# Patient Record
Sex: Female | Born: 1955 | Race: White | Hispanic: No | Marital: Married | Smoking: Former smoker
Health system: Southern US, Community
[De-identification: ages and names within clinical notes are randomized; demographics above are authoritative.]

## PROBLEM LIST (undated history)

## (undated) DIAGNOSIS — N951 Menopausal and female climacteric states: Secondary | ICD-10-CM

## (undated) DIAGNOSIS — F410 Panic disorder [episodic paroxysmal anxiety] without agoraphobia: Secondary | ICD-10-CM

## (undated) DIAGNOSIS — M8580 Other specified disorders of bone density and structure, unspecified site: Secondary | ICD-10-CM

## (undated) DIAGNOSIS — K2100 Gastro-esophageal reflux disease with esophagitis, without bleeding: Secondary | ICD-10-CM

## (undated) DIAGNOSIS — J309 Allergic rhinitis, unspecified: Secondary | ICD-10-CM

## (undated) DIAGNOSIS — B001 Herpesviral vesicular dermatitis: Secondary | ICD-10-CM

## (undated) DIAGNOSIS — R079 Chest pain, unspecified: Secondary | ICD-10-CM

## (undated) DIAGNOSIS — R0602 Shortness of breath: Secondary | ICD-10-CM

## (undated) DIAGNOSIS — H9190 Unspecified hearing loss, unspecified ear: Secondary | ICD-10-CM

## (undated) DIAGNOSIS — K589 Irritable bowel syndrome without diarrhea: Secondary | ICD-10-CM

## (undated) DIAGNOSIS — E785 Hyperlipidemia, unspecified: Secondary | ICD-10-CM

## (undated) DIAGNOSIS — M1712 Unilateral primary osteoarthritis, left knee: Secondary | ICD-10-CM

## (undated) HISTORY — DX: Menopausal and female climacteric states: N95.1

## (undated) HISTORY — DX: Herpesviral vesicular dermatitis: B00.1

## (undated) HISTORY — DX: Hyperlipidemia, unspecified: E78.5

## (undated) HISTORY — DX: Irritable bowel syndrome, unspecified: K58.9

## (undated) HISTORY — DX: Gastro-esophageal reflux disease with esophagitis: K21.0

## (undated) HISTORY — DX: Shortness of breath: R06.02

## (undated) HISTORY — DX: Unspecified hearing loss, unspecified ear: H91.90

## (undated) HISTORY — DX: Unilateral primary osteoarthritis, left knee: M17.12

## (undated) HISTORY — DX: Chest pain, unspecified: R07.9

## (undated) HISTORY — DX: Panic disorder (episodic paroxysmal anxiety): F41.0

## (undated) HISTORY — DX: Gastro-esophageal reflux disease with esophagitis, without bleeding: K21.00

## (undated) HISTORY — DX: Other specified disorders of bone density and structure, unspecified site: M85.80

## (undated) HISTORY — DX: Allergic rhinitis, unspecified: J30.9

---

## 1981-03-16 HISTORY — PX: OTHER SURGICAL HISTORY: SHX169

## 2000-02-17 ENCOUNTER — Other Ambulatory Visit: Admission: RE | Admit: 2000-02-17 | Discharge: 2000-02-17 | Payer: Self-pay | Admitting: Gynecology

## 2000-02-18 ENCOUNTER — Encounter: Payer: Self-pay | Admitting: Gynecology

## 2000-02-18 ENCOUNTER — Encounter: Admission: RE | Admit: 2000-02-18 | Discharge: 2000-02-18 | Payer: Self-pay | Admitting: Gynecology

## 2000-02-24 ENCOUNTER — Encounter: Admission: RE | Admit: 2000-02-24 | Discharge: 2000-02-24 | Payer: Self-pay | Admitting: Gynecology

## 2000-02-24 ENCOUNTER — Encounter: Payer: Self-pay | Admitting: Gynecology

## 2001-04-26 ENCOUNTER — Other Ambulatory Visit: Admission: RE | Admit: 2001-04-26 | Discharge: 2001-04-26 | Payer: Self-pay | Admitting: Gynecology

## 2002-07-18 ENCOUNTER — Encounter: Admission: RE | Admit: 2002-07-18 | Discharge: 2002-07-18 | Payer: Self-pay | Admitting: Gynecology

## 2002-07-18 ENCOUNTER — Encounter: Payer: Self-pay | Admitting: Gynecology

## 2002-07-25 ENCOUNTER — Other Ambulatory Visit: Admission: RE | Admit: 2002-07-25 | Discharge: 2002-07-25 | Payer: Self-pay | Admitting: Gynecology

## 2003-10-22 ENCOUNTER — Other Ambulatory Visit: Admission: RE | Admit: 2003-10-22 | Discharge: 2003-10-22 | Payer: Self-pay | Admitting: Gynecology

## 2003-10-22 ENCOUNTER — Encounter: Admission: RE | Admit: 2003-10-22 | Discharge: 2003-10-22 | Payer: Self-pay | Admitting: Gynecology

## 2004-12-01 ENCOUNTER — Other Ambulatory Visit: Admission: RE | Admit: 2004-12-01 | Discharge: 2004-12-01 | Payer: Self-pay | Admitting: Gynecology

## 2005-07-30 ENCOUNTER — Other Ambulatory Visit: Admission: RE | Admit: 2005-07-30 | Discharge: 2005-07-30 | Payer: Self-pay | Admitting: Gynecology

## 2006-01-25 ENCOUNTER — Other Ambulatory Visit: Admission: RE | Admit: 2006-01-25 | Discharge: 2006-01-25 | Payer: Self-pay | Admitting: Gynecology

## 2006-02-11 ENCOUNTER — Encounter: Admission: RE | Admit: 2006-02-11 | Discharge: 2006-02-11 | Payer: Self-pay | Admitting: Gynecology

## 2007-02-22 ENCOUNTER — Other Ambulatory Visit: Admission: RE | Admit: 2007-02-22 | Discharge: 2007-02-22 | Payer: Self-pay | Admitting: Gynecology

## 2007-12-15 ENCOUNTER — Encounter: Admission: RE | Admit: 2007-12-15 | Discharge: 2007-12-15 | Payer: Self-pay | Admitting: Gynecology

## 2008-03-16 HISTORY — PX: HYSTEROSCOPY W/ ENDOMETRIAL ABLATION: SUR665

## 2008-06-14 ENCOUNTER — Ambulatory Visit (HOSPITAL_BASED_OUTPATIENT_CLINIC_OR_DEPARTMENT_OTHER): Admission: RE | Admit: 2008-06-14 | Discharge: 2008-06-14 | Payer: Self-pay | Admitting: Gynecology

## 2008-06-14 ENCOUNTER — Encounter (INDEPENDENT_AMBULATORY_CARE_PROVIDER_SITE_OTHER): Payer: Self-pay | Admitting: Gynecology

## 2009-06-17 ENCOUNTER — Encounter: Admission: RE | Admit: 2009-06-17 | Discharge: 2009-06-17 | Payer: Self-pay | Admitting: Gynecology

## 2010-03-28 ENCOUNTER — Encounter: Payer: Self-pay | Admitting: Cardiology

## 2010-04-14 ENCOUNTER — Encounter: Payer: Self-pay | Admitting: Cardiology

## 2010-04-14 ENCOUNTER — Ambulatory Visit
Admission: RE | Admit: 2010-04-14 | Discharge: 2010-04-14 | Payer: Self-pay | Source: Home / Self Care | Attending: Cardiology | Admitting: Cardiology

## 2010-04-14 DIAGNOSIS — R0602 Shortness of breath: Secondary | ICD-10-CM

## 2010-04-14 DIAGNOSIS — R079 Chest pain, unspecified: Secondary | ICD-10-CM

## 2010-04-14 HISTORY — DX: Chest pain, unspecified: R07.9

## 2010-04-14 HISTORY — DX: Shortness of breath: R06.02

## 2010-04-17 ENCOUNTER — Telehealth (INDEPENDENT_AMBULATORY_CARE_PROVIDER_SITE_OTHER): Payer: Self-pay | Admitting: *Deleted

## 2010-04-21 ENCOUNTER — Ambulatory Visit (HOSPITAL_COMMUNITY): Payer: Managed Care, Other (non HMO) | Attending: Cardiology

## 2010-04-21 ENCOUNTER — Encounter: Payer: Self-pay | Admitting: Cardiology

## 2010-04-21 ENCOUNTER — Ambulatory Visit (HOSPITAL_BASED_OUTPATIENT_CLINIC_OR_DEPARTMENT_OTHER): Payer: Managed Care, Other (non HMO)

## 2010-04-21 DIAGNOSIS — R0989 Other specified symptoms and signs involving the circulatory and respiratory systems: Secondary | ICD-10-CM

## 2010-04-21 DIAGNOSIS — R5381 Other malaise: Secondary | ICD-10-CM | POA: Insufficient documentation

## 2010-04-21 DIAGNOSIS — R0609 Other forms of dyspnea: Secondary | ICD-10-CM | POA: Insufficient documentation

## 2010-04-21 DIAGNOSIS — Z8249 Family history of ischemic heart disease and other diseases of the circulatory system: Secondary | ICD-10-CM | POA: Insufficient documentation

## 2010-04-21 DIAGNOSIS — R072 Precordial pain: Secondary | ICD-10-CM | POA: Insufficient documentation

## 2010-04-21 DIAGNOSIS — E785 Hyperlipidemia, unspecified: Secondary | ICD-10-CM | POA: Insufficient documentation

## 2010-04-23 NOTE — Progress Notes (Signed)
Summary: Stress Echo pre procedure  Phone Note Outgoing Call Call back at Home Phone [phone]   Summary of Call: Annice Pih call patient with information for rest/stress echo on Monday, 04/21/10 at 3:00 p.m.; stress at 4 p.m.

## 2010-04-23 NOTE — Assessment & Plan Note (Signed)
Summary: dx: dypsnea on excertion/lg   Visit Type:  Initial Consult Primary Provider:  Dr. Eloise Harman  CC:  family hx -- dyspnea -- heart burn?.  History of Present Illness: 55 yo with history of GERD and hyperlipidemia presents for evaluation of dyspnea and chest pain.  Patient is worried due to her family history of heart disease (mother with MI, father with atrial fibrillation).  She reports episodes of tightness across her chest that occur 1-2 times a month. They last anywhere from 5-30 minutes at a time.  They are not related to meals and do not feel like her typical GERD.  They are not exertional and usually occur while she is sitting at her desk at work.  She does not get exertional chest pain but she does get some exertional shortness of breath.  She notes dyspnea walking up a flight of steps or when she exercises on a treadmill.  She does report less activity in general over the last year.    ECG: NSR, normal  Labs (1/12): K 4.1, creatinine 0.7, LDL 108, HDL 59  Preventive Screening-Counseling & Management  Alcohol-Tobacco     Smoking Status: quit  Caffeine-Diet-Exercise     Does Patient Exercise: yes      Drug Use:  no.    Past History:  Past Medical History: 1. Hyperlipidemia 2. Uterine fibroids, s/p surgery 3. GERD  Past Surgical History: fibroid tumors removed tubal ligation  Family History: Mother with MI at 39 Father with atrial fibrillation Brother with HTN  Social History: Full Time -- Advertising account executive  Married -- 2 kids Tobacco Use - Former. (quit around 2000) Alcohol Use - yes -- social Regular Exercise - yes Drug Use - no Smoking Status:  quit Does Patient Exercise:  yes Drug Use:  no  Review of Systems       All systems reviewed and negative except as per HPI.   Vital Signs:  Patient profile:   55 year old female Height:      67 inches Weight:      185 pounds BMI:     29.08 Pulse rate:   67 / minute BP sitting:   136 / 80  (left  arm) Cuff size:   regular  Vitals Entered By: Hardin Negus, RMA (April 14, 2010 10:47 AM)  Physical Exam  General:  Well developed, well nourished, in no acute distress. Head:  normocephalic and atraumatic Nose:  no deformity, discharge, inflammation, or lesions Mouth:  Teeth, gums and palate normal. Oral mucosa normal. Neck:  Neck supple, no JVD. No masses, thyromegaly or abnormal cervical nodes. Lungs:  Clear bilaterally to auscultation and percussion. Heart:  Non-displaced PMI, chest non-tender; regular rate and rhythm, S1, S2 without murmurs, rubs or gallops. Carotid upstroke normal, no bruit.  Pedals normal pulses. No edema, no varicosities. Abdomen:  Bowel sounds positive; abdomen soft and non-tender without masses, organomegaly, or hernias noted. No hepatosplenomegaly. Msk:  Back normal, normal gait. Muscle strength and tone normal. Extremities:  No clubbing or cyanosis. Neurologic:  Alert and oriented x 3. Skin:  Intact without lesions or rashes. Psych:  Normal affect.   Problems:  Medical Problems Added: 1)  Dx of Chest Pain-unspecified  (ICD-786.50) 2)  Dx of Dyspnea  (ICD-786.05)  Impression & Recommendations:  Problem # 1:  CHEST PAIN-UNSPECIFIED (ICD-786.50)  Atypical chest pain with some exertional dyspnea.  Pain is not clearly related to meals or exertion.  ECG is normal.  Main risk factor is hyperlipidemia.  She does have a family history of CAD but not premature.  I will arrange for her to get a stress echocardiogram for risk stratification.  Hopefully this will be normal.  Most likely pain is noncardiac.   Her updated medication list for this problem includes:    Aspirin 81 Mg Tbec (Aspirin) .Marland Kitchen... Take one tablet by mouth daily  Other Orders: EKG w/ Interpretation (93000) Stress Echo (Stress Echo) Echocardiogram (Echo)  Patient Instructions: 1)  Your physician recommends that you schedule a follow-up appointment in: 1 YEAR--COME SOONER IF NECESSARY 2)   Your physician has requested that you have a stress echocardiogram. For further information please visit [email or web address]  Please follow instruction sheet as given. 3)  Your physician has requested that you have an echocardiogram.  Echocardiography is a painless test that uses sound waves to create images of your heart. It provides your doctor with information about the size and shape of your heart and how well your heart's chambers and valves are working.  This procedure takes approximately one hour. There are no restrictions for this procedure.

## 2010-06-25 LAB — POCT HEMOGLOBIN-HEMACUE: Hemoglobin: 14.6 g/dL (ref 12.0–15.0)

## 2010-07-29 NOTE — Op Note (Signed)
NAMEMARIBEL, [name]              ACCOUNT NO.:  [id number]   MEDICAL RECORD NO.:  [id number]          PATIENT TYPE:  AMB   LOCATION:  [facility]                         FACILITY:  [facility]   PHYSICIAN:  Gretta Cool, M.D. 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   POSTOPERATIVE DIAGNOSIS:  Probable adenomyosis.   OPERATION PERFORMED:  Hysteroscopy, total endometrial resection for  ablation plus Vaportrode.   SURGEON:  Gretta Cool, M.D.   ANESTHESIA:  IV sedation and paracervical block.   DESCRIPTION OF PROCEDURE:  Under excellent anesthesia as above with the  patient prepped and draped in Allen stirrups, bladder drained, the  cervix was grasped with a single toothed tenaculum.  A paracervical  block was administered.  Then the cervix was progressively dilated with  a series of Pratt dilators to accommodate a 7 mm resectoscope.  The  resectoscope was then used to photograph the cavity.  There was no  evidence of suspected submucous leiomyomata but with resection, deep  pockets of adenomyosis were identified suggesting adenomyosis in both  anterior and posterior walls as a cause for her abnormality on  ultrasound.  Once the entire depth of adenomyosis had been resected and  there was no other evidence of gland epithelium, the cavity was then  treated by Vaportrode throughout the entire cavity.  The cornual area  was treated by touch technique.  At the end of the procedure the  bleeding was minimal.  The procedure was terminated without  complication.  The patient was then transferred to the recovery room in  excellent condition.           ______________________________  Gretta Cool, M.D.     CWL/MEDQ  D:  06/14/2008  T:  06/14/2008  Job:  [id number]   cc:   Dr. Jarold Motto, GMA

## 2010-07-31 ENCOUNTER — Other Ambulatory Visit: Payer: Self-pay | Admitting: Gynecology

## 2010-07-31 DIAGNOSIS — Z1231 Encounter for screening mammogram for malignant neoplasm of breast: Secondary | ICD-10-CM

## 2010-09-23 ENCOUNTER — Ambulatory Visit
Admission: RE | Admit: 2010-09-23 | Discharge: 2010-09-23 | Disposition: A | Discharge status: Home / Self Care | Payer: Managed Care, Other (non HMO) | Source: Ambulatory Visit | Attending: Gynecology | Admitting: Gynecology

## 2010-09-23 ENCOUNTER — Other Ambulatory Visit: Payer: Self-pay | Admitting: Gynecology

## 2010-09-23 DIAGNOSIS — Z1231 Encounter for screening mammogram for malignant neoplasm of breast: Secondary | ICD-10-CM

## 2011-04-09 ENCOUNTER — Other Ambulatory Visit: Payer: Self-pay | Admitting: Gynecology

## 2012-01-05 ENCOUNTER — Other Ambulatory Visit: Payer: Self-pay | Admitting: Gynecology

## 2012-01-05 DIAGNOSIS — Z1231 Encounter for screening mammogram for malignant neoplasm of breast: Secondary | ICD-10-CM

## 2012-01-20 ENCOUNTER — Ambulatory Visit
Admission: RE | Admit: 2012-01-20 | Discharge: 2012-01-20 | Disposition: A | Discharge status: Home / Self Care | Payer: BC Managed Care – PPO | Source: Ambulatory Visit | Attending: Gynecology | Admitting: Gynecology

## 2012-01-20 DIAGNOSIS — Z1231 Encounter for screening mammogram for malignant neoplasm of breast: Secondary | ICD-10-CM

## 2013-05-25 ENCOUNTER — Ambulatory Visit (HOSPITAL_COMMUNITY)
Admission: RE | Admit: 2013-05-25 | Discharge: 2013-05-25 | Disposition: A | Discharge status: Home / Self Care | Payer: BC Managed Care – PPO | Source: Ambulatory Visit | Attending: Vascular Surgery | Admitting: Vascular Surgery

## 2013-05-25 ENCOUNTER — Other Ambulatory Visit (HOSPITAL_COMMUNITY): Payer: Self-pay | Admitting: Family Medicine

## 2013-05-25 DIAGNOSIS — Z8249 Family history of ischemic heart disease and other diseases of the circulatory system: Secondary | ICD-10-CM

## 2013-06-30 ENCOUNTER — Ambulatory Visit
Admission: RE | Admit: 2013-06-30 | Discharge: 2013-06-30 | Disposition: A | Discharge status: Home / Self Care | Payer: BC Managed Care – PPO | Source: Ambulatory Visit | Attending: Family Medicine | Admitting: Family Medicine

## 2013-06-30 ENCOUNTER — Other Ambulatory Visit: Payer: Self-pay | Admitting: Family Medicine

## 2013-06-30 ENCOUNTER — Other Ambulatory Visit (HOSPITAL_COMMUNITY)
Admission: RE | Admit: 2013-06-30 | Discharge: 2013-06-30 | Disposition: A | Discharge status: Home / Self Care | Payer: BC Managed Care – PPO | Source: Ambulatory Visit | Attending: Family Medicine | Admitting: Family Medicine

## 2013-06-30 DIAGNOSIS — Z01419 Encounter for gynecological examination (general) (routine) without abnormal findings: Secondary | ICD-10-CM | POA: Insufficient documentation

## 2013-06-30 DIAGNOSIS — M25569 Pain in unspecified knee: Secondary | ICD-10-CM

## 2014-11-16 ENCOUNTER — Other Ambulatory Visit: Payer: Self-pay

## 2014-11-16 DIAGNOSIS — Z719 Counseling, unspecified: Secondary | ICD-10-CM

## 2014-11-21 ENCOUNTER — Ambulatory Visit
Admission: RE | Admit: 2014-11-21 | Discharge: 2014-11-21 | Disposition: A | Discharge status: Home / Self Care | Payer: BLUE CROSS/BLUE SHIELD | Source: Ambulatory Visit

## 2014-11-21 DIAGNOSIS — Z719 Counseling, unspecified: Secondary | ICD-10-CM

## 2015-06-20 DIAGNOSIS — M179 Osteoarthritis of knee, unspecified: Secondary | ICD-10-CM | POA: Diagnosis not present

## 2015-08-01 DIAGNOSIS — G8929 Other chronic pain: Secondary | ICD-10-CM | POA: Diagnosis not present

## 2015-08-01 DIAGNOSIS — M179 Osteoarthritis of knee, unspecified: Secondary | ICD-10-CM | POA: Diagnosis not present

## 2015-08-21 DIAGNOSIS — M1712 Unilateral primary osteoarthritis, left knee: Secondary | ICD-10-CM | POA: Diagnosis not present

## 2015-08-21 DIAGNOSIS — M25562 Pain in left knee: Secondary | ICD-10-CM | POA: Diagnosis not present

## 2015-08-29 DIAGNOSIS — M25562 Pain in left knee: Secondary | ICD-10-CM | POA: Diagnosis not present

## 2015-08-29 DIAGNOSIS — M1712 Unilateral primary osteoarthritis, left knee: Secondary | ICD-10-CM | POA: Diagnosis not present

## 2015-09-05 DIAGNOSIS — M1712 Unilateral primary osteoarthritis, left knee: Secondary | ICD-10-CM | POA: Diagnosis not present

## 2015-09-05 DIAGNOSIS — M25562 Pain in left knee: Secondary | ICD-10-CM | POA: Diagnosis not present

## 2015-09-12 DIAGNOSIS — M25562 Pain in left knee: Secondary | ICD-10-CM | POA: Diagnosis not present

## 2015-09-12 DIAGNOSIS — M1712 Unilateral primary osteoarthritis, left knee: Secondary | ICD-10-CM | POA: Diagnosis not present

## 2015-09-19 DIAGNOSIS — M25562 Pain in left knee: Secondary | ICD-10-CM | POA: Diagnosis not present

## 2015-09-19 DIAGNOSIS — M1712 Unilateral primary osteoarthritis, left knee: Secondary | ICD-10-CM | POA: Diagnosis not present

## 2016-01-24 DIAGNOSIS — F410 Panic disorder [episodic paroxysmal anxiety] without agoraphobia: Secondary | ICD-10-CM | POA: Diagnosis not present

## 2016-01-24 DIAGNOSIS — E782 Mixed hyperlipidemia: Secondary | ICD-10-CM | POA: Diagnosis not present

## 2016-01-24 DIAGNOSIS — K219 Gastro-esophageal reflux disease without esophagitis: Secondary | ICD-10-CM | POA: Diagnosis not present

## 2016-01-24 DIAGNOSIS — B001 Herpesviral vesicular dermatitis: Secondary | ICD-10-CM | POA: Diagnosis not present

## 2016-04-08 ENCOUNTER — Other Ambulatory Visit: Payer: Self-pay | Admitting: Family Medicine

## 2016-04-08 DIAGNOSIS — Z1231 Encounter for screening mammogram for malignant neoplasm of breast: Secondary | ICD-10-CM

## 2016-04-15 ENCOUNTER — Telehealth: Payer: Self-pay

## 2016-04-15 DIAGNOSIS — Z0001 Encounter for general adult medical examination with abnormal findings: Secondary | ICD-10-CM | POA: Diagnosis not present

## 2016-04-15 DIAGNOSIS — R140 Abdominal distension (gaseous): Secondary | ICD-10-CM | POA: Diagnosis not present

## 2016-04-15 DIAGNOSIS — M8588 Other specified disorders of bone density and structure, other site: Secondary | ICD-10-CM | POA: Diagnosis not present

## 2016-04-15 DIAGNOSIS — N951 Menopausal and female climacteric states: Secondary | ICD-10-CM | POA: Diagnosis not present

## 2016-04-15 DIAGNOSIS — J309 Allergic rhinitis, unspecified: Secondary | ICD-10-CM | POA: Diagnosis not present

## 2016-04-15 NOTE — Telephone Encounter (Signed)
SENT NOTES TO SCHEDULING 

## 2016-04-23 NOTE — Progress Notes (Signed)
Cardiology Office Note    Date:  04/24/2016   ID:  Angela [name], DOB 1955/11/06, MRN [id number]  PCP:  Leanor RubensteinSUN,VYVYAN Y, MD  Cardiologist: Lesleigh NoeHenry W Smith III, MD   Chief Complaint  Patient presents with  . Palpitations  . Shortness of Breath    History of Present Illness:  Angela [name] is a 61 y.o. female who is here for evaluation of palpitations and dyspnea on exertion.  The above-noted complaints do not occur in concert. The palpitations occur predominantly when she is at rest or when she lies down. She feels her heart rate increases unexpectedly. She shows recordings from her fit that that shows spontaneous increases in heart rate into the 110 to 130 bpm minute range. These episodes of brief and usually resolve within 5 minutes. There is no associated dyspnea or chest discomfort.  She complains of excessive exertional dyspnea. Has shortness of breath with this simple activity of coming into the office today. She denies orthopnea. This no associated palpitation or chest pain. No significant lower extremity swelling.  Past Medical History:  Diagnosis Date  . Allergic rhinitis   . CHEST PAIN-UNSPECIFIED 04/14/2010   Qualifier: Diagnosis of  By: Bascom LevelsFrazier, RMA, Sherri    . DYSPNEA 04/14/2010   Qualifier: Diagnosis of  By: Bascom LevelsFrazier, RMA, Sherri    . Fever blister   . Hearing loss   . Hyperlipidemia   . IBS (irritable bowel syndrome)   . Osteopenia   . Panic attack   . Primary osteoarthritis of left knee   . Reflux esophagitis   . Symptomatic menopausal or female climacteric states     Past Surgical History:  Procedure Laterality Date  . btl  1983  . HYSTEROSCOPY W/ ENDOMETRIAL ABLATION  2010    Current Medications: Outpatient Medications Prior to Visit  Medication Sig Dispense Refill  . ALPRAZolam (XANAX) 0.5 MG tablet Take 0.5 mg by mouth at bedtime as needed for anxiety.    Marland Kitchen. aspirin EC 81 MG tablet Take 81 mg by mouth daily.    . Cholecalciferol (VITAMIN D3) 2000  units TABS Take 2,000 Units by mouth daily.    . Glucosamine-Chondroit-Vit C-Mn (GLUCOSAMINE 1500 COMPLEX PO) Take 1,500 mg by mouth daily.    Marland Kitchen. levocetirizine (XYZAL) 5 MG tablet Take 5 mg by mouth every evening.    . Multiple Vitamins-Minerals (WOMENS 50+ ADVANCED) CAPS Take by mouth.    . pantoprazole (PROTONIX) 40 MG tablet Take 40 mg by mouth daily.    . pravastatin (PRAVACHOL) 20 MG tablet Take 20 mg by mouth daily.    Marland Kitchen. loratadine (CLARITIN) 10 MG tablet Take 10 mg by mouth daily.     No facility-administered medications prior to visit.      Allergies:   Benadryl [diphenhydramine]   Social History   Social History  . Marital status: Married    Spouse name: N/A  . Number of children: N/A  . Years of education: N/A   Social History Main Topics  . Smoking status: Former Smoker    Types: Cigarettes  . Smokeless tobacco: Never Used  . Alcohol use Yes     Comment: rarely  . Drug use: No  . Sexual activity: Yes     Comment: married   Other Topics Concern  . None   Social History Narrative  . None     Family History:  The patient's family history includes AAA (abdominal aortic aneurysm) in her father, paternal aunt, and paternal uncle; Anxiety  disorder in her brother; Atrial fibrillation in her father; Colon polyps in her father; Healthy in her son and son; Heart attack in her mother; Hypertension in her brother; Parkinson's disease in her father; Supraventricular tachycardia in her sister.  ROS:   Please see the history of present illness.    Snoring which she doesn't feel is a big deal but her husband complains about it. She has no specifics. She does feel tired frequently. She has been screened for abdominal aortic aneurysm and it was unremarkable. All other systems reviewed and are negative.   PHYSICAL EXAM:   VS:  BP (!) 144/94 (BP Location: Left Arm)   Pulse 73   Ht 5\' 6"  (1.676 m)   Wt 192 lb (87.1 kg)   BMI 30.99 kg/m    GEN: Well nourished, well developed,  in no acute distress  HEENT: normal  Neck: no JVD, carotid bruits, or masses Cardiac: RRR; no murmurs, rubs, or gallops,no edema  Respiratory:  clear to auscultation bilaterally, normal work of breathing GI: soft, nontender, nondistended, + BS MS: no deformity or atrophy  Skin: warm and dry, no rash Neuro:  Alert and Oriented x 3, Strength and sensation are intact Psych: euthymic mood, full affect  Wt Readings from Last 3 Encounters:  04/24/16 192 lb (87.1 kg)  04/14/10 185 lb (83.9 kg)      Studies/Labs Reviewed:   EKG:  EKG  Normal sinus rhythm with low voltage.  Recent Labs: No results found for requested labs within last 8760 hours.   Lipid Panel No results found for: CHOL, TRIG, HDL, CHOLHDL, VLDL, LDLCALC, LDLDIRECT  Additional studies/ records that were reviewed today include:  Prior abdominal ultrasound study for abdominal aortic aneurysm and was apparently without evidence of such.    ASSESSMENT:    1. DYSPNEA   2. Tachycardia   3. Family history of abdominal aortic aneurysm      PLAN:  In order of problems listed above:  1. Exercise treadmill test to exclude ischemia as a basis for dyspnea. 2. 30 day monitor to rule out atrial fibrillation or pathologic arrhythmias. 3. Previously evaluated and no evidence of aneurysm as recently as 2 years ago.    Medication Adjustments/Labs and Tests Ordered: Current medicines are reviewed at length with the patient today.  Concerns regarding medicines are outlined above.  Medication changes, Labs and Tests ordered today are listed in the Patient Instructions below. Patient Instructions  Medication Instructions:  None  Labwork: None  Testing/Procedures: Your physician has recommended that you wear an event monitor. Event monitors are medical devices that record the heart's electrical activity. Doctors most often [place] these monitors to diagnose arrhythmias. Arrhythmias are problems with the speed or rhythm of the  heartbeat. The monitor is a small, portable device. You can wear one while you do your normal daily activities. This is usually used to diagnose what is causing palpitations/syncope (passing out).  Your physician has requested that you have an exercise tolerance test. For further information please visit [email or web address] Please also follow instruction sheet, as given.   Follow-Up: Your physician recommends that you schedule a follow-up appointment as needed with Dr. Katrinka Blazing.    Any Other Special Instructions Will Be Listed Below (If Applicable).     If you need a refill on your cardiac medications before your next appointment, please call your pharmacy.      Signed, Lesleigh Noe, MD  04/24/2016 10:47 AM    [facility]  [address] [place], [place]  [address] Phone: [phone]; Fax: [phone]

## 2016-04-24 ENCOUNTER — Ambulatory Visit (INDEPENDENT_AMBULATORY_CARE_PROVIDER_SITE_OTHER): Payer: BLUE CROSS/BLUE SHIELD | Admitting: Interventional Cardiology

## 2016-04-24 ENCOUNTER — Encounter (INDEPENDENT_AMBULATORY_CARE_PROVIDER_SITE_OTHER): Payer: Self-pay

## 2016-04-24 ENCOUNTER — Encounter: Payer: Self-pay | Admitting: Interventional Cardiology

## 2016-04-24 VITALS — BP 144/94 | HR 73 | Ht 66.0 in | Wt 192.0 lb

## 2016-04-24 DIAGNOSIS — Z8249 Family history of ischemic heart disease and other diseases of the circulatory system: Secondary | ICD-10-CM

## 2016-04-24 DIAGNOSIS — R0602 Shortness of breath: Secondary | ICD-10-CM | POA: Diagnosis not present

## 2016-04-24 DIAGNOSIS — R000 Tachycardia, unspecified: Secondary | ICD-10-CM | POA: Diagnosis not present

## 2016-04-24 NOTE — Patient Instructions (Signed)
Medication Instructions:  None  Labwork: None  Testing/Procedures: Your physician has recommended that you wear an event monitor. Event monitors are medical devices that record the heart's electrical activity. Doctors most often us these monitors to diagnose arrhythmias. Arrhythmias are problems with the speed or rhythm of the heartbeat. The monitor is a small, portable device. You can wear one while you do your normal daily activities. This is usually used to diagnose what is causing palpitations/syncope (passing out).  Your physician has requested that you have an exercise tolerance test. For further information please visit [email or web address]. Please also follow instruction sheet, as given.   Follow-Up: Your physician recommends that you schedule a follow-up appointment as needed with Dr. Smith.    Any Other Special Instructions Will Be Listed Below (If Applicable).     If you need a refill on your cardiac medications before your next appointment, please call your pharmacy.   

## 2016-04-28 ENCOUNTER — Ambulatory Visit: Payer: BLUE CROSS/BLUE SHIELD

## 2016-05-12 ENCOUNTER — Ambulatory Visit
Admission: RE | Admit: 2016-05-12 | Discharge: 2016-05-12 | Disposition: A | Discharge status: Home / Self Care | Payer: BLUE CROSS/BLUE SHIELD | Source: Ambulatory Visit | Attending: Family Medicine | Admitting: Family Medicine

## 2016-05-12 DIAGNOSIS — Z1231 Encounter for screening mammogram for malignant neoplasm of breast: Secondary | ICD-10-CM

## 2016-05-19 DIAGNOSIS — M8588 Other specified disorders of bone density and structure, other site: Secondary | ICD-10-CM | POA: Diagnosis not present

## 2016-05-29 ENCOUNTER — Other Ambulatory Visit: Payer: Self-pay | Admitting: Interventional Cardiology

## 2016-05-29 ENCOUNTER — Ambulatory Visit (INDEPENDENT_AMBULATORY_CARE_PROVIDER_SITE_OTHER): Payer: BLUE CROSS/BLUE SHIELD

## 2016-05-29 DIAGNOSIS — R0602 Shortness of breath: Secondary | ICD-10-CM

## 2016-05-29 DIAGNOSIS — R002 Palpitations: Secondary | ICD-10-CM | POA: Diagnosis not present

## 2016-05-29 DIAGNOSIS — R000 Tachycardia, unspecified: Secondary | ICD-10-CM

## 2016-05-29 LAB — EXERCISE TOLERANCE TEST
CHL CUP MPHR: 159 {beats}/min
CHL CUP RESTING HR STRESS: 91 {beats}/min
CSEPPHR: 155 {beats}/min
Estimated workload: 8.5 METS
Exercise duration (min): 7 min
Exercise duration (sec): 0 s
Percent HR: 97 %
RPE: 17

## 2016-06-02 DIAGNOSIS — R002 Palpitations: Secondary | ICD-10-CM | POA: Diagnosis not present

## 2016-07-10 DIAGNOSIS — J309 Allergic rhinitis, unspecified: Secondary | ICD-10-CM | POA: Diagnosis not present

## 2017-04-16 ENCOUNTER — Other Ambulatory Visit: Payer: Self-pay | Admitting: Family Medicine

## 2017-04-16 ENCOUNTER — Other Ambulatory Visit (HOSPITAL_COMMUNITY)
Admission: RE | Admit: 2017-04-16 | Discharge: 2017-04-16 | Disposition: A | Discharge status: Home / Self Care | Payer: BLUE CROSS/BLUE SHIELD | Source: Ambulatory Visit | Attending: Family Medicine | Admitting: Family Medicine

## 2017-04-16 DIAGNOSIS — Z0000 Encounter for general adult medical examination without abnormal findings: Secondary | ICD-10-CM | POA: Diagnosis not present

## 2017-04-16 DIAGNOSIS — K219 Gastro-esophageal reflux disease without esophagitis: Secondary | ICD-10-CM | POA: Diagnosis not present

## 2017-04-16 DIAGNOSIS — J309 Allergic rhinitis, unspecified: Secondary | ICD-10-CM | POA: Diagnosis not present

## 2017-04-16 DIAGNOSIS — E782 Mixed hyperlipidemia: Secondary | ICD-10-CM | POA: Diagnosis not present

## 2017-04-16 DIAGNOSIS — B001 Herpesviral vesicular dermatitis: Secondary | ICD-10-CM | POA: Diagnosis not present

## 2017-04-20 LAB — CYTOLOGY - PAP
Diagnosis: NEGATIVE
HPV (WINDOPATH): NOT DETECTED

## 2017-06-03 ENCOUNTER — Other Ambulatory Visit: Payer: Self-pay | Admitting: Orthopedic Surgery

## 2017-06-03 DIAGNOSIS — R531 Weakness: Secondary | ICD-10-CM

## 2017-06-03 DIAGNOSIS — M25361 Other instability, right knee: Secondary | ICD-10-CM

## 2017-06-03 DIAGNOSIS — G8929 Other chronic pain: Secondary | ICD-10-CM | POA: Diagnosis not present

## 2017-06-03 DIAGNOSIS — M25561 Pain in right knee: Secondary | ICD-10-CM

## 2017-06-03 DIAGNOSIS — M170 Bilateral primary osteoarthritis of knee: Secondary | ICD-10-CM | POA: Diagnosis not present

## 2017-06-06 ENCOUNTER — Inpatient Hospital Stay
Admission: RE | Admit: 2017-06-06 | Discharge: 2017-06-06 | Disposition: A | Discharge status: Home / Self Care | Payer: BLUE CROSS/BLUE SHIELD | Source: Ambulatory Visit | Attending: Orthopedic Surgery | Admitting: Orthopedic Surgery

## 2017-06-06 ENCOUNTER — Ambulatory Visit
Admission: RE | Admit: 2017-06-06 | Discharge: 2017-06-06 | Disposition: A | Discharge status: Home / Self Care | Payer: BLUE CROSS/BLUE SHIELD | Source: Ambulatory Visit | Attending: Orthopedic Surgery | Admitting: Orthopedic Surgery

## 2017-06-06 DIAGNOSIS — M25561 Pain in right knee: Secondary | ICD-10-CM | POA: Diagnosis not present

## 2017-06-06 DIAGNOSIS — R531 Weakness: Secondary | ICD-10-CM

## 2017-06-06 DIAGNOSIS — M25361 Other instability, right knee: Secondary | ICD-10-CM

## 2017-06-17 DIAGNOSIS — R936 Abnormal findings on diagnostic imaging of limbs: Secondary | ICD-10-CM | POA: Diagnosis not present

## 2017-06-17 DIAGNOSIS — M25561 Pain in right knee: Secondary | ICD-10-CM | POA: Diagnosis not present

## 2017-06-17 DIAGNOSIS — G8929 Other chronic pain: Secondary | ICD-10-CM | POA: Diagnosis not present

## 2017-06-17 DIAGNOSIS — M1712 Unilateral primary osteoarthritis, left knee: Secondary | ICD-10-CM | POA: Diagnosis not present

## 2017-08-13 ENCOUNTER — Telehealth: Payer: Self-pay

## 2017-08-13 NOTE — Telephone Encounter (Signed)
Spoke with Pt and informed that she will need to be seen in office for further evaluation for pre-op clearance. Appointment scheduled for 08/27/17 at 930 am with Francis Dowse, PA. Encounter will be e-faxed to [facility] .

## 2017-08-13 NOTE — Telephone Encounter (Signed)
   [facility] Pre-operative Risk Assessment    Request for surgical clearance:  1. What type of surgery is being performed? Knee- Lt Uni-Medial   2. When is this surgery scheduled? 08/18/17   3. What type of clearance is required (medical clearance vs. Pharmacy clearance to hold med vs. Both)? Medical  4. Are there any medications that need to be held prior to surgery and how long? none   5. Practice name and name of physician performing surgery?  [facility] [place]/ Dr Ronnie Derby   6. What is your office phone number  [phone]    7.   What is your office fax number [phone]  8.   Anesthesia type (None, local, MAC, general) ? Spinal   Angela [name] 08/13/2017, 12:21 PM  _________________________________________________________________   (provider comments below)

## 2017-08-13 NOTE — Telephone Encounter (Signed)
   Primary Cardiologist:Henry Malissa Hippo III, MD  Chart reviewed as part of pre-operative protocol coverage. Because of ELLEEN COULIBALY past medical history and time since last visit, he/she will require a follow-up visit in order to better assess preoperative cardiovascular risk.  Pre-op covering staff: - Please schedule appointment and call patient to inform them. - Please contact requesting surgeon's office via preferred method (i.e, phone, fax) to inform them of need for appointment prior to surgery.  Theodore Demark, PA-C  08/13/2017, 4:18 PM

## 2017-08-16 NOTE — Progress Notes (Signed)
Cardiology Office Note Date:  08/17/2017  Patient ID:  Angela [name], DOB Mar 12, 1956, MRN [id number] PCP:  Deatra JamesSun, Vyvyan, MD  Cardiologist:  Dr. Katrinka BlazingSmith   Chief Complaint: pre-op  History of Present Illness: Angela [name] is a 62 y.o. female with history of HLD, IBS, there is family hx of CAD (not premature), and AAA.  She comes in today to be seen for pre-operative evaluation.  She was last seen in our office 04/24/16 referred to Dr. Katrinka BlazingSmith to evaluate dyspnea and palpitations.  Her EST was reported as low risk, her EM noted SR/ST only.  She is pending left knee surgery tomorrow, with worsening L knee pain for several years, now requiring surgery.  She denies any exertional intolerances, no CP, palpitations or SOB, no DOE or symptoms of PND or orthopnea.    RCRI: no available labs for review to know what her baseline creatanine is, there is no hx of renal disease would make her score 0, with risk of 0.4% DASI: 9.89 METS    Past Medical History:  Diagnosis Date  . Allergic rhinitis   . CHEST PAIN-UNSPECIFIED 04/14/2010   Qualifier: Diagnosis of  By: Bascom LevelsFrazier, RMA, Sherri    . DYSPNEA 04/14/2010   Qualifier: Diagnosis of  By: Bascom LevelsFrazier, RMA, Sherri    . Fever blister   . Hearing loss   . Hyperlipidemia   . IBS (irritable bowel syndrome)   . Osteopenia   . Panic attack   . Primary osteoarthritis of left knee   . Reflux esophagitis   . Symptomatic menopausal or female climacteric states     Past Surgical History:  Procedure Laterality Date  . btl  1983  . HYSTEROSCOPY W/ ENDOMETRIAL ABLATION  2010    Current Outpatient Medications  Medication Sig Dispense Refill  . ALPRAZolam (XANAX) 0.5 MG tablet Take 0.5 mg by mouth at bedtime as needed for anxiety.    Marland Kitchen. aspirin EC 81 MG tablet Take 81 mg by mouth daily.    . Cholecalciferol (VITAMIN D3) 2000 units TABS Take 2,000 Units by mouth daily.    . Glucosamine-Chondroit-Vit C-Mn (GLUCOSAMINE 1500 COMPLEX PO) Take 1,500 mg by mouth  daily.    Marland Kitchen. levocetirizine (XYZAL) 5 MG tablet Take 5 mg by mouth every evening.    . Multiple Vitamins-Minerals (WOMENS 50+ ADVANCED) CAPS Take by mouth.    . pantoprazole (PROTONIX) 40 MG tablet Take 40 mg by mouth daily.    . pravastatin (PRAVACHOL) 20 MG tablet Take 20 mg by mouth daily.    . Simethicone (GAS-X EXTRA STRENGTH PO) Take 1 tablet by mouth daily. (OTC)    . valACYclovir (VALTREX) 1000 MG tablet Take 1 g by mouth daily.  3   No current facility-administered medications for this visit.     Allergies:   Hydrocortisone and Benadryl [diphenhydramine]   Social History:  The patient  reports that she has quit smoking. Her smoking use included cigarettes. She has never used smokeless tobacco. She reports that she drinks alcohol. She reports that she does not use drugs.   Family History:  The patient's family history includes AAA (abdominal aortic aneurysm) in her father, paternal aunt, and paternal uncle; Anxiety disorder in her brother; Atrial fibrillation in her father; Colon polyps in her father; Healthy in her son and son; Heart attack in her mother; Hypertension in her brother; Parkinson's disease in her father; Supraventricular tachycardia in her sister.   ROS:  Please see the history of present illness.  All other systems are reviewed and otherwise negative.   PHYSICAL EXAM:  VS:  BP 124/78   Pulse 68   Ht 5\' 6"  (1.676 m)   Wt 181 lb (82.1 kg)   BMI 29.21 kg/m  BMI: Body mass index is 29.21 kg/m. Well nourished, well developed, in no acute distress  HEENT: normocephalic, atraumatic  Neck: no JVD, carotid bruits or masses Cardiac:  RRR; no significant murmurs, no rubs, or gallops Lungs:  CTA b/l, no wheezing, rhonchi or rales  Abd: soft, nontender MS: no deformity or atrophy Ext: no edema  Skin: warm and dry, no rash Neuro:  No gross deficits appreciated Psych: euthymic mood, full affect    EKG:  Done today and reviewed by myself SR, 68bpm, appears unchanged  from previous  05/29/16: 30 day EM  Normal sinus rhythm and occasional sinus tachycardia  No complaints or correlation with abnormal rhythm.   Sinus rhythm and sinus tachycardia. No abnormal heart rhythms are noted.  05/29/16: EST  Blood pressure demonstrated a normal response to exercise.  Horizontal ST segment depression ST segment depression of 1.5 mm was noted during stress in the V4 and V5 leads, and returning to baseline after less than 1 minute of recovery.   1. Normal exercise tolerance. (walking 7 minutes on Bruce protocol) 2. ST depression noted at peak exercise in leads V4 and V5 with quick resolution in recovery.  This may suggest ischemia though it is not a high risk finding.   Recent Labs: No results found for requested labs within last 8760 hours.  No results found for requested labs within last 8760 hours.   CrCl cannot be calculated (No order found.).   Wt Readings from Last 3 Encounters:  08/17/17 181 lb (82.1 kg)  04/24/16 192 lb (87.1 kg)  04/14/10 185 lb (83.9 kg)     Other studies reviewed: Additional studies/records reviewed today include: summarized above  ASSESSMENT AND PLAN:  1. Pre-op     Reviewed stress test from last year with DOD, agreed, is low risk result     Patient is low cardiac risk for the surgery planned  2. HLD     Monitored and managed by her PMD  Reviewed healthy cardiac lifestyle    Disposition: F/u with [place] as needed  Current medicines are reviewed at length with the patient today.  The patient did not have any concerns regarding medicines.  Norma Fredrickson, PA-C 08/17/2017 9:54 AM     [facility] [address] [place] [address] [phone] (office)  [phone] (fax)

## 2017-08-17 ENCOUNTER — Ambulatory Visit (INDEPENDENT_AMBULATORY_CARE_PROVIDER_SITE_OTHER): Payer: BLUE CROSS/BLUE SHIELD | Admitting: Physician Assistant

## 2017-08-17 ENCOUNTER — Encounter (INDEPENDENT_AMBULATORY_CARE_PROVIDER_SITE_OTHER): Payer: Self-pay

## 2017-08-17 VITALS — BP 124/78 | HR 68 | Ht 66.0 in | Wt 181.0 lb

## 2017-08-17 DIAGNOSIS — Z01818 Encounter for other preprocedural examination: Secondary | ICD-10-CM

## 2017-08-17 NOTE — Patient Instructions (Signed)

## 2017-08-18 DIAGNOSIS — M94262 Chondromalacia, left knee: Secondary | ICD-10-CM | POA: Diagnosis not present

## 2017-08-18 DIAGNOSIS — Z96651 Presence of right artificial knee joint: Secondary | ICD-10-CM | POA: Diagnosis not present

## 2017-08-18 DIAGNOSIS — M1712 Unilateral primary osteoarthritis, left knee: Secondary | ICD-10-CM | POA: Diagnosis not present

## 2017-08-23 DIAGNOSIS — M1712 Unilateral primary osteoarthritis, left knee: Secondary | ICD-10-CM | POA: Diagnosis not present

## 2017-08-26 DIAGNOSIS — Z96652 Presence of left artificial knee joint: Secondary | ICD-10-CM | POA: Diagnosis not present

## 2017-08-27 DIAGNOSIS — M1712 Unilateral primary osteoarthritis, left knee: Secondary | ICD-10-CM | POA: Diagnosis not present

## 2017-08-31 DIAGNOSIS — M1712 Unilateral primary osteoarthritis, left knee: Secondary | ICD-10-CM | POA: Diagnosis not present

## 2017-09-02 DIAGNOSIS — M1712 Unilateral primary osteoarthritis, left knee: Secondary | ICD-10-CM | POA: Diagnosis not present

## 2017-09-06 DIAGNOSIS — M1712 Unilateral primary osteoarthritis, left knee: Secondary | ICD-10-CM | POA: Diagnosis not present

## 2017-09-08 DIAGNOSIS — M1712 Unilateral primary osteoarthritis, left knee: Secondary | ICD-10-CM | POA: Diagnosis not present

## 2017-09-13 DIAGNOSIS — M1712 Unilateral primary osteoarthritis, left knee: Secondary | ICD-10-CM | POA: Diagnosis not present

## 2017-09-17 DIAGNOSIS — M1712 Unilateral primary osteoarthritis, left knee: Secondary | ICD-10-CM | POA: Diagnosis not present

## 2017-09-20 DIAGNOSIS — M1712 Unilateral primary osteoarthritis, left knee: Secondary | ICD-10-CM | POA: Diagnosis not present

## 2017-09-21 ENCOUNTER — Other Ambulatory Visit: Payer: Self-pay | Admitting: Family Medicine

## 2017-09-21 DIAGNOSIS — Z1231 Encounter for screening mammogram for malignant neoplasm of breast: Secondary | ICD-10-CM

## 2017-09-24 DIAGNOSIS — M1712 Unilateral primary osteoarthritis, left knee: Secondary | ICD-10-CM | POA: Diagnosis not present

## 2017-09-27 DIAGNOSIS — M1712 Unilateral primary osteoarthritis, left knee: Secondary | ICD-10-CM | POA: Diagnosis not present

## 2017-10-01 DIAGNOSIS — M1712 Unilateral primary osteoarthritis, left knee: Secondary | ICD-10-CM | POA: Diagnosis not present

## 2017-10-04 DIAGNOSIS — M1712 Unilateral primary osteoarthritis, left knee: Secondary | ICD-10-CM | POA: Diagnosis not present

## 2017-10-08 DIAGNOSIS — M1712 Unilateral primary osteoarthritis, left knee: Secondary | ICD-10-CM | POA: Diagnosis not present

## 2017-10-11 DIAGNOSIS — M1712 Unilateral primary osteoarthritis, left knee: Secondary | ICD-10-CM | POA: Diagnosis not present

## 2017-10-12 ENCOUNTER — Ambulatory Visit
Admission: RE | Admit: 2017-10-12 | Discharge: 2017-10-12 | Disposition: A | Discharge status: Home / Self Care | Payer: BLUE CROSS/BLUE SHIELD | Source: Ambulatory Visit | Attending: Family Medicine | Admitting: Family Medicine

## 2017-10-12 DIAGNOSIS — Z1231 Encounter for screening mammogram for malignant neoplasm of breast: Secondary | ICD-10-CM | POA: Diagnosis not present

## 2018-04-18 DIAGNOSIS — J019 Acute sinusitis, unspecified: Secondary | ICD-10-CM | POA: Diagnosis not present

## 2018-04-19 DIAGNOSIS — E782 Mixed hyperlipidemia: Secondary | ICD-10-CM | POA: Diagnosis not present

## 2018-04-19 DIAGNOSIS — B001 Herpesviral vesicular dermatitis: Secondary | ICD-10-CM | POA: Diagnosis not present

## 2018-04-19 DIAGNOSIS — F410 Panic disorder [episodic paroxysmal anxiety] without agoraphobia: Secondary | ICD-10-CM | POA: Diagnosis not present

## 2018-04-19 DIAGNOSIS — Z0000 Encounter for general adult medical examination without abnormal findings: Secondary | ICD-10-CM | POA: Diagnosis not present

## 2018-04-19 DIAGNOSIS — K219 Gastro-esophageal reflux disease without esophagitis: Secondary | ICD-10-CM | POA: Diagnosis not present

## 2018-09-20 DIAGNOSIS — L820 Inflamed seborrheic keratosis: Secondary | ICD-10-CM | POA: Diagnosis not present

## 2018-10-12 ENCOUNTER — Other Ambulatory Visit: Payer: Self-pay | Admitting: Family Medicine

## 2018-10-12 DIAGNOSIS — Z1231 Encounter for screening mammogram for malignant neoplasm of breast: Secondary | ICD-10-CM

## 2018-12-02 ENCOUNTER — Ambulatory Visit
Admission: RE | Admit: 2018-12-02 | Discharge: 2018-12-02 | Disposition: A | Discharge status: Home / Self Care | Payer: BLUE CROSS/BLUE SHIELD | Source: Ambulatory Visit | Attending: Family Medicine | Admitting: Family Medicine

## 2018-12-02 ENCOUNTER — Other Ambulatory Visit: Payer: Self-pay

## 2018-12-02 DIAGNOSIS — Z1231 Encounter for screening mammogram for malignant neoplasm of breast: Secondary | ICD-10-CM

## 2019-05-09 DIAGNOSIS — F410 Panic disorder [episodic paroxysmal anxiety] without agoraphobia: Secondary | ICD-10-CM | POA: Diagnosis not present

## 2019-05-09 DIAGNOSIS — B001 Herpesviral vesicular dermatitis: Secondary | ICD-10-CM | POA: Diagnosis not present

## 2019-05-09 DIAGNOSIS — K219 Gastro-esophageal reflux disease without esophagitis: Secondary | ICD-10-CM | POA: Diagnosis not present

## 2019-05-09 DIAGNOSIS — Z0000 Encounter for general adult medical examination without abnormal findings: Secondary | ICD-10-CM | POA: Diagnosis not present

## 2019-05-09 DIAGNOSIS — E782 Mixed hyperlipidemia: Secondary | ICD-10-CM | POA: Diagnosis not present

## 2019-05-11 ENCOUNTER — Other Ambulatory Visit: Payer: Self-pay | Admitting: Family Medicine

## 2019-05-11 DIAGNOSIS — M8580 Other specified disorders of bone density and structure, unspecified site: Secondary | ICD-10-CM

## 2019-05-30 ENCOUNTER — Ambulatory Visit
Admission: RE | Admit: 2019-05-30 | Discharge: 2019-05-30 | Disposition: A | Discharge status: Home / Self Care | Payer: BLUE CROSS/BLUE SHIELD | Source: Ambulatory Visit | Attending: Family Medicine | Admitting: Family Medicine

## 2019-05-30 ENCOUNTER — Other Ambulatory Visit: Payer: Self-pay

## 2019-05-30 DIAGNOSIS — M8589 Other specified disorders of bone density and structure, multiple sites: Secondary | ICD-10-CM | POA: Diagnosis not present

## 2019-05-30 DIAGNOSIS — M8580 Other specified disorders of bone density and structure, unspecified site: Secondary | ICD-10-CM

## 2019-05-30 DIAGNOSIS — Z780 Asymptomatic menopausal state: Secondary | ICD-10-CM | POA: Diagnosis not present

## 2019-10-19 DIAGNOSIS — N758 Other diseases of Bartholin's gland: Secondary | ICD-10-CM | POA: Diagnosis not present

## 2019-10-19 DIAGNOSIS — N762 Acute vulvitis: Secondary | ICD-10-CM | POA: Diagnosis not present

## 2019-10-26 ENCOUNTER — Other Ambulatory Visit: Payer: Self-pay | Admitting: Family Medicine

## 2019-10-26 DIAGNOSIS — Z1231 Encounter for screening mammogram for malignant neoplasm of breast: Secondary | ICD-10-CM

## 2019-10-27 DIAGNOSIS — N762 Acute vulvitis: Secondary | ICD-10-CM | POA: Diagnosis not present

## 2019-12-04 ENCOUNTER — Ambulatory Visit
Admission: RE | Admit: 2019-12-04 | Discharge: 2019-12-04 | Disposition: A | Discharge status: Home / Self Care | Payer: BLUE CROSS/BLUE SHIELD | Source: Ambulatory Visit | Attending: Family Medicine | Admitting: Family Medicine

## 2019-12-04 ENCOUNTER — Other Ambulatory Visit: Payer: Self-pay

## 2019-12-04 DIAGNOSIS — Z1231 Encounter for screening mammogram for malignant neoplasm of breast: Secondary | ICD-10-CM

## 2020-02-21 DIAGNOSIS — H35711 Central serous chorioretinopathy, right eye: Secondary | ICD-10-CM | POA: Diagnosis not present

## 2020-02-21 DIAGNOSIS — H5213 Myopia, bilateral: Secondary | ICD-10-CM | POA: Diagnosis not present

## 2020-02-21 DIAGNOSIS — H2513 Age-related nuclear cataract, bilateral: Secondary | ICD-10-CM | POA: Diagnosis not present

## 2020-05-22 DIAGNOSIS — M8588 Other specified disorders of bone density and structure, other site: Secondary | ICD-10-CM | POA: Diagnosis not present

## 2020-05-22 DIAGNOSIS — Z0000 Encounter for general adult medical examination without abnormal findings: Secondary | ICD-10-CM | POA: Diagnosis not present

## 2020-05-22 DIAGNOSIS — J309 Allergic rhinitis, unspecified: Secondary | ICD-10-CM | POA: Diagnosis not present

## 2020-05-22 DIAGNOSIS — Z1159 Encounter for screening for other viral diseases: Secondary | ICD-10-CM | POA: Diagnosis not present

## 2020-05-22 DIAGNOSIS — R69 Illness, unspecified: Secondary | ICD-10-CM | POA: Diagnosis not present

## 2020-05-22 DIAGNOSIS — K219 Gastro-esophageal reflux disease without esophagitis: Secondary | ICD-10-CM | POA: Diagnosis not present

## 2020-05-22 DIAGNOSIS — B001 Herpesviral vesicular dermatitis: Secondary | ICD-10-CM | POA: Diagnosis not present

## 2020-05-22 DIAGNOSIS — Z1389 Encounter for screening for other disorder: Secondary | ICD-10-CM | POA: Diagnosis not present

## 2020-05-22 DIAGNOSIS — E782 Mixed hyperlipidemia: Secondary | ICD-10-CM | POA: Diagnosis not present

## 2020-05-22 DIAGNOSIS — Z23 Encounter for immunization: Secondary | ICD-10-CM | POA: Diagnosis not present

## 2020-09-23 DIAGNOSIS — N898 Other specified noninflammatory disorders of vagina: Secondary | ICD-10-CM | POA: Diagnosis not present

## 2020-09-23 DIAGNOSIS — R69 Illness, unspecified: Secondary | ICD-10-CM | POA: Diagnosis not present

## 2020-09-23 DIAGNOSIS — R300 Dysuria: Secondary | ICD-10-CM | POA: Diagnosis not present

## 2020-09-23 DIAGNOSIS — N816 Rectocele: Secondary | ICD-10-CM | POA: Diagnosis not present

## 2020-11-20 ENCOUNTER — Other Ambulatory Visit: Payer: Self-pay | Admitting: Family Medicine

## 2020-11-20 DIAGNOSIS — Z1231 Encounter for screening mammogram for malignant neoplasm of breast: Secondary | ICD-10-CM

## 2020-12-03 DIAGNOSIS — Z8619 Personal history of other infectious and parasitic diseases: Secondary | ICD-10-CM | POA: Diagnosis not present

## 2020-12-03 DIAGNOSIS — R69 Illness, unspecified: Secondary | ICD-10-CM | POA: Diagnosis not present

## 2020-12-03 DIAGNOSIS — A599 Trichomoniasis, unspecified: Secondary | ICD-10-CM | POA: Diagnosis not present

## 2020-12-24 ENCOUNTER — Other Ambulatory Visit: Payer: Self-pay

## 2020-12-24 ENCOUNTER — Ambulatory Visit
Admission: RE | Admit: 2020-12-24 | Discharge: 2020-12-24 | Disposition: A | Discharge status: Home / Self Care | Payer: Medicare HMO | Source: Ambulatory Visit | Attending: Family Medicine | Admitting: Family Medicine

## 2020-12-24 DIAGNOSIS — Z1231 Encounter for screening mammogram for malignant neoplasm of breast: Secondary | ICD-10-CM

## 2021-01-22 ENCOUNTER — Telehealth: Payer: Self-pay | Admitting: Interventional Cardiology

## 2021-01-22 NOTE — Telephone Encounter (Signed)
Pt is wanting to have testing done at our new [facility] location that tests her calcium score... she is aware that this is something she will have to pay for but is wanting it for her own peace of mind.. could you advise.

## 2021-01-22 NOTE — Telephone Encounter (Signed)
Pt advised that she has not been seen since 2019 and would need an appt to be seen prior to ordering any testing... she verbalized her understanding and will try taking with her PCP about ordering prior to making an appt here. She says she has ben feeling well with no problems and just wanted to have it done for her own piece of mind.

## 2021-02-10 ENCOUNTER — Other Ambulatory Visit (HOSPITAL_BASED_OUTPATIENT_CLINIC_OR_DEPARTMENT_OTHER): Payer: Self-pay | Admitting: Family Medicine

## 2021-02-18 ENCOUNTER — Ambulatory Visit (HOSPITAL_BASED_OUTPATIENT_CLINIC_OR_DEPARTMENT_OTHER)
Admission: RE | Admit: 2021-02-18 | Discharge: 2021-02-18 | Disposition: A | Discharge status: Home / Self Care | Payer: Medicare HMO | Source: Ambulatory Visit | Attending: Family Medicine | Admitting: Family Medicine

## 2021-02-18 ENCOUNTER — Other Ambulatory Visit: Payer: Self-pay

## 2021-02-18 DIAGNOSIS — I700 Atherosclerosis of aorta: Secondary | ICD-10-CM | POA: Insufficient documentation

## 2021-02-18 DIAGNOSIS — Z136 Encounter for screening for cardiovascular disorders: Secondary | ICD-10-CM | POA: Insufficient documentation

## 2021-04-23 DIAGNOSIS — K449 Diaphragmatic hernia without obstruction or gangrene: Secondary | ICD-10-CM | POA: Diagnosis not present

## 2021-04-23 DIAGNOSIS — I700 Atherosclerosis of aorta: Secondary | ICD-10-CM | POA: Diagnosis not present

## 2021-04-23 DIAGNOSIS — R911 Solitary pulmonary nodule: Secondary | ICD-10-CM | POA: Diagnosis not present

## 2021-04-23 DIAGNOSIS — K219 Gastro-esophageal reflux disease without esophagitis: Secondary | ICD-10-CM | POA: Diagnosis not present

## 2021-04-23 DIAGNOSIS — R030 Elevated blood-pressure reading, without diagnosis of hypertension: Secondary | ICD-10-CM | POA: Diagnosis not present

## 2021-08-01 IMAGING — MG MM DIGITAL SCREENING BILAT W/ TOMO W/ CAD
8 series · 8 of 24 positions shown · non-contrast
Comparison: Previous exam(s).

CLINICAL DATA: Screening.

EXAM:
DIGITAL SCREENING BILATERAL MAMMOGRAM WITH TOMO AND CAD

[R CC synth-2D]
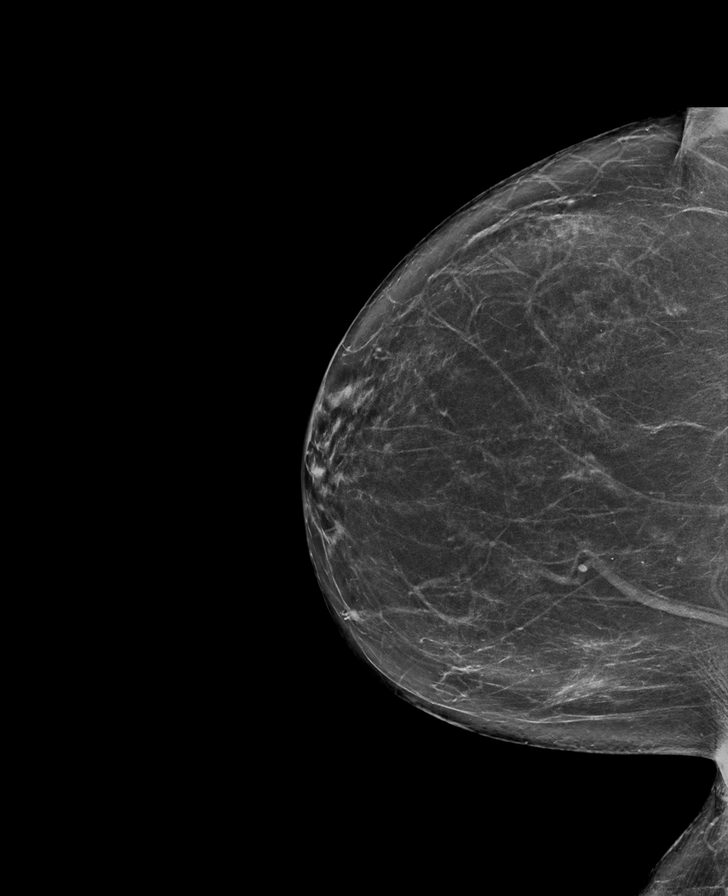

[L CC synth-2D]
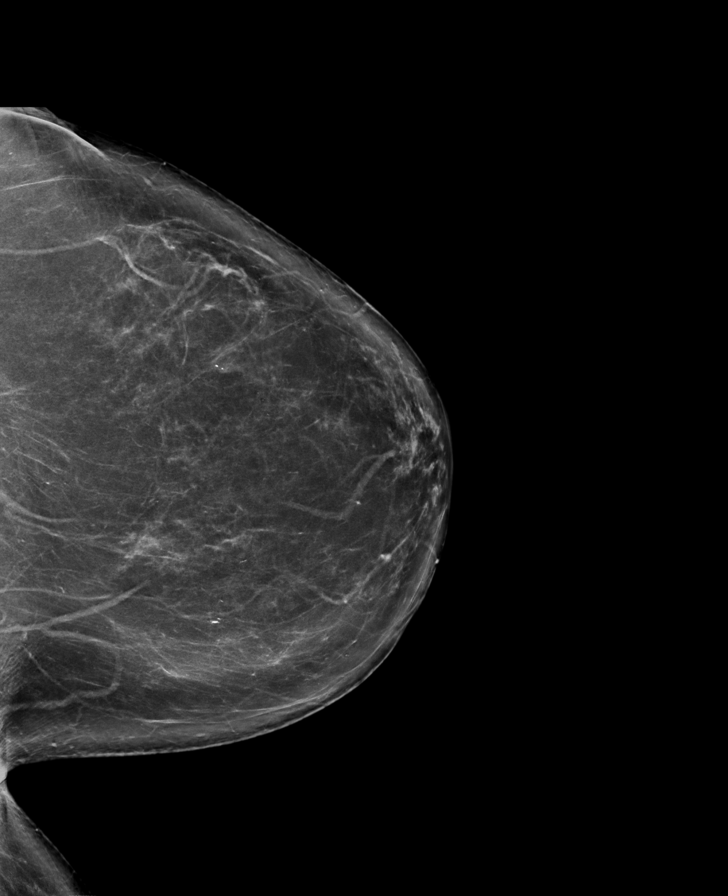

[R MLO synth-2D]
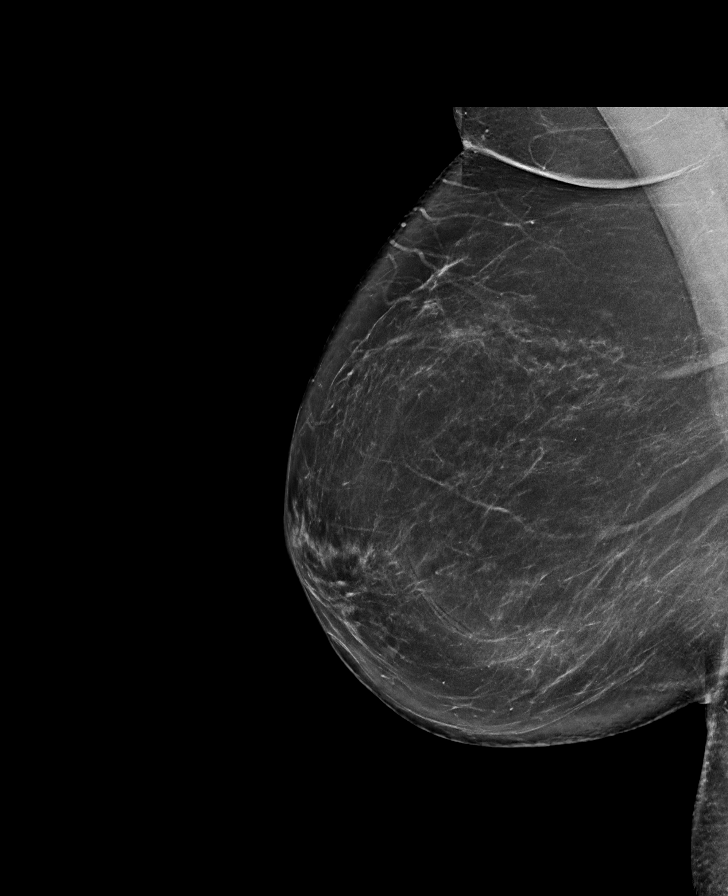

[L MLO synth-2D]
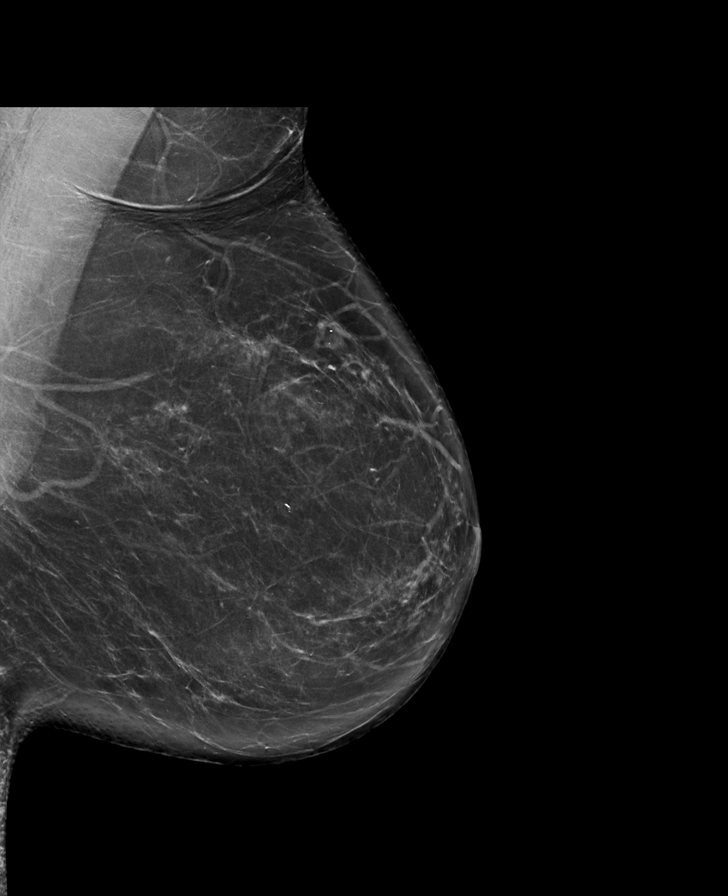

[R MLO tomo · tomo slice 43/86.0]
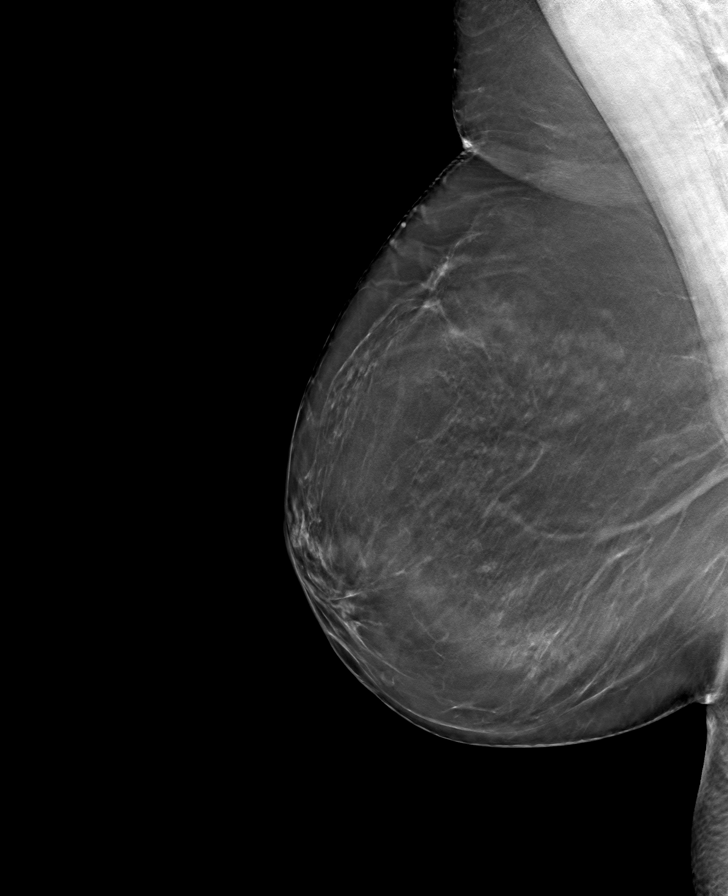

[L CC tomo · tomo slice 43/84.0]
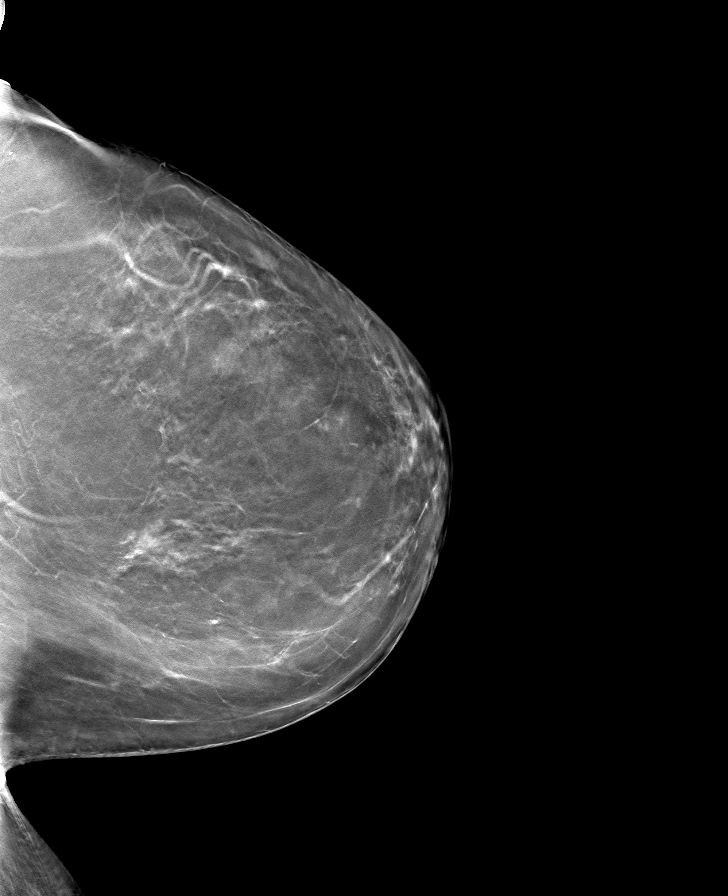

[R CC tomo · tomo slice 39/77.0]
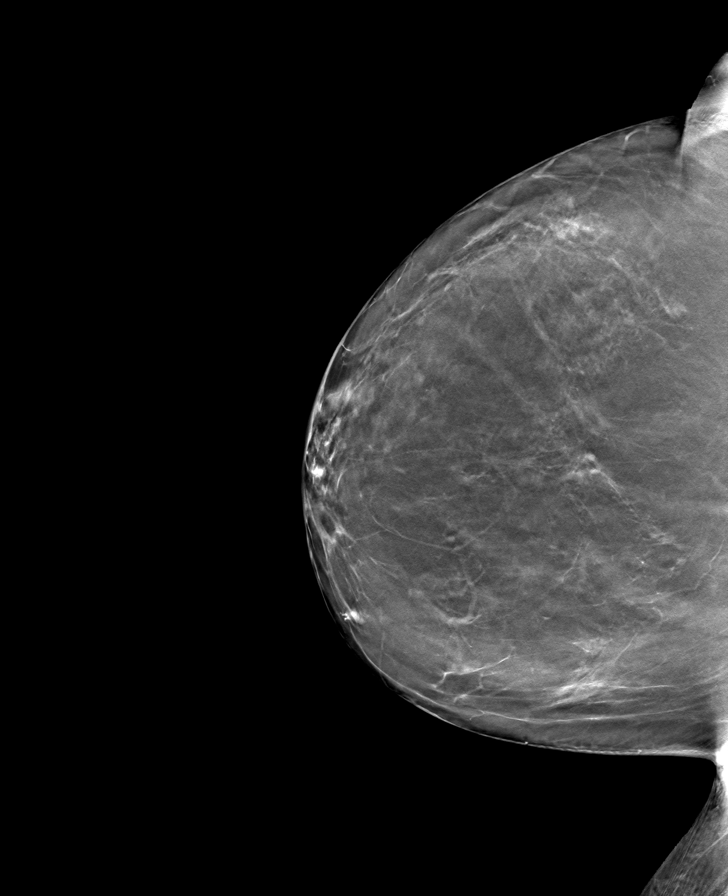

[L MLO tomo · tomo slice 45/90.0]
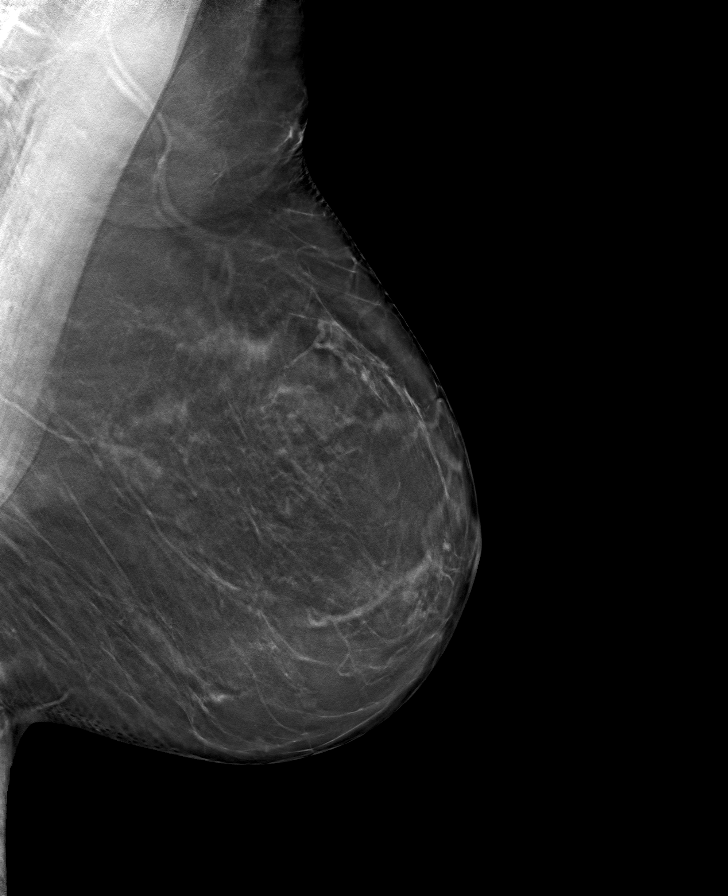

[8 of 24 positions shown; findings below may reference images not displayed]

ACR Breast Density Category b: There are scattered areas of
fibroglandular density.
FINDINGS: There are no findings suspicious for malignancy. Images were
processed with CAD.
IMPRESSION: No mammographic evidence of malignancy. A result letter of this
screening mammogram will be mailed directly to the patient.

RECOMMENDATION:
Screening mammogram in one year. (Code:[id number])

BI-RADS CATEGORY  1: Negative.

## 2021-09-02 DIAGNOSIS — H43811 Vitreous degeneration, right eye: Secondary | ICD-10-CM | POA: Diagnosis not present

## 2021-09-02 DIAGNOSIS — H31011 Macula scars of posterior pole (postinflammatory) (post-traumatic), right eye: Secondary | ICD-10-CM | POA: Diagnosis not present

## 2021-09-02 DIAGNOSIS — H43391 Other vitreous opacities, right eye: Secondary | ICD-10-CM | POA: Diagnosis not present

## 2021-09-02 DIAGNOSIS — H35361 Drusen (degenerative) of macula, right eye: Secondary | ICD-10-CM | POA: Diagnosis not present

## 2021-09-22 DIAGNOSIS — Z01419 Encounter for gynecological examination (general) (routine) without abnormal findings: Secondary | ICD-10-CM | POA: Diagnosis not present

## 2021-09-22 DIAGNOSIS — Z9189 Other specified personal risk factors, not elsewhere classified: Secondary | ICD-10-CM | POA: Diagnosis not present

## 2021-09-22 DIAGNOSIS — R69 Illness, unspecified: Secondary | ICD-10-CM | POA: Diagnosis not present

## 2021-09-22 DIAGNOSIS — Z124 Encounter for screening for malignant neoplasm of cervix: Secondary | ICD-10-CM | POA: Diagnosis not present

## 2021-09-22 DIAGNOSIS — Z1151 Encounter for screening for human papillomavirus (HPV): Secondary | ICD-10-CM | POA: Diagnosis not present

## 2021-12-05 ENCOUNTER — Other Ambulatory Visit: Payer: Self-pay | Admitting: Family Medicine

## 2021-12-05 DIAGNOSIS — Z1231 Encounter for screening mammogram for malignant neoplasm of breast: Secondary | ICD-10-CM

## 2021-12-12 DIAGNOSIS — F410 Panic disorder [episodic paroxysmal anxiety] without agoraphobia: Secondary | ICD-10-CM | POA: Diagnosis not present

## 2021-12-12 DIAGNOSIS — R69 Illness, unspecified: Secondary | ICD-10-CM | POA: Diagnosis not present

## 2021-12-12 DIAGNOSIS — E782 Mixed hyperlipidemia: Secondary | ICD-10-CM | POA: Diagnosis not present

## 2021-12-12 DIAGNOSIS — J309 Allergic rhinitis, unspecified: Secondary | ICD-10-CM | POA: Diagnosis not present

## 2021-12-12 DIAGNOSIS — B001 Herpesviral vesicular dermatitis: Secondary | ICD-10-CM | POA: Diagnosis not present

## 2021-12-12 DIAGNOSIS — Z1331 Encounter for screening for depression: Secondary | ICD-10-CM | POA: Diagnosis not present

## 2022-01-14 ENCOUNTER — Ambulatory Visit
Admission: RE | Admit: 2022-01-14 | Discharge: 2022-01-14 | Disposition: A | Discharge status: Home / Self Care | Payer: Medicare HMO | Source: Ambulatory Visit | Attending: Family Medicine | Admitting: Family Medicine

## 2022-01-14 DIAGNOSIS — Z1231 Encounter for screening mammogram for malignant neoplasm of breast: Secondary | ICD-10-CM | POA: Diagnosis not present

## 2022-06-29 DIAGNOSIS — K219 Gastro-esophageal reflux disease without esophagitis: Secondary | ICD-10-CM | POA: Diagnosis not present

## 2022-06-29 DIAGNOSIS — M8588 Other specified disorders of bone density and structure, other site: Secondary | ICD-10-CM | POA: Diagnosis not present

## 2022-06-29 DIAGNOSIS — R69 Illness, unspecified: Secondary | ICD-10-CM | POA: Diagnosis not present

## 2022-06-29 DIAGNOSIS — Z0000 Encounter for general adult medical examination without abnormal findings: Secondary | ICD-10-CM | POA: Diagnosis not present

## 2022-06-29 DIAGNOSIS — L820 Inflamed seborrheic keratosis: Secondary | ICD-10-CM | POA: Diagnosis not present

## 2022-06-29 DIAGNOSIS — B001 Herpesviral vesicular dermatitis: Secondary | ICD-10-CM | POA: Diagnosis not present

## 2022-06-29 DIAGNOSIS — Z23 Encounter for immunization: Secondary | ICD-10-CM | POA: Diagnosis not present

## 2022-06-29 DIAGNOSIS — E782 Mixed hyperlipidemia: Secondary | ICD-10-CM | POA: Diagnosis not present

## 2022-06-29 DIAGNOSIS — I700 Atherosclerosis of aorta: Secondary | ICD-10-CM | POA: Diagnosis not present

## 2022-06-29 DIAGNOSIS — J309 Allergic rhinitis, unspecified: Secondary | ICD-10-CM | POA: Diagnosis not present

## 2022-06-29 DIAGNOSIS — Z1389 Encounter for screening for other disorder: Secondary | ICD-10-CM | POA: Diagnosis not present

## 2022-06-30 ENCOUNTER — Other Ambulatory Visit: Payer: Self-pay | Admitting: Family Medicine

## 2022-06-30 DIAGNOSIS — M8580 Other specified disorders of bone density and structure, unspecified site: Secondary | ICD-10-CM

## 2022-06-30 DIAGNOSIS — Z0000 Encounter for general adult medical examination without abnormal findings: Secondary | ICD-10-CM

## 2022-07-22 DIAGNOSIS — L820 Inflamed seborrheic keratosis: Secondary | ICD-10-CM | POA: Diagnosis not present

## 2022-09-16 ENCOUNTER — Other Ambulatory Visit: Payer: Self-pay | Admitting: Family Medicine

## 2022-09-16 DIAGNOSIS — Z8249 Family history of ischemic heart disease and other diseases of the circulatory system: Secondary | ICD-10-CM

## 2022-09-29 ENCOUNTER — Other Ambulatory Visit (HOSPITAL_COMMUNITY): Payer: Self-pay

## 2022-09-29 DIAGNOSIS — N816 Rectocele: Secondary | ICD-10-CM | POA: Diagnosis not present

## 2022-09-29 DIAGNOSIS — H2510 Age-related nuclear cataract, unspecified eye: Secondary | ICD-10-CM | POA: Diagnosis not present

## 2022-09-29 DIAGNOSIS — H5213 Myopia, bilateral: Secondary | ICD-10-CM | POA: Diagnosis not present

## 2022-09-29 DIAGNOSIS — Z01419 Encounter for gynecological examination (general) (routine) without abnormal findings: Secondary | ICD-10-CM | POA: Diagnosis not present

## 2022-09-29 DIAGNOSIS — N8110 Cystocele, unspecified: Secondary | ICD-10-CM | POA: Diagnosis not present

## 2022-09-29 DIAGNOSIS — H35719 Central serous chorioretinopathy, unspecified eye: Secondary | ICD-10-CM | POA: Diagnosis not present

## 2022-09-29 DIAGNOSIS — H524 Presbyopia: Secondary | ICD-10-CM | POA: Diagnosis not present

## 2022-09-29 DIAGNOSIS — N952 Postmenopausal atrophic vaginitis: Secondary | ICD-10-CM | POA: Diagnosis not present

## 2022-09-29 DIAGNOSIS — H52209 Unspecified astigmatism, unspecified eye: Secondary | ICD-10-CM | POA: Diagnosis not present

## 2022-09-30 ENCOUNTER — Ambulatory Visit
Admission: RE | Admit: 2022-09-30 | Discharge: 2022-09-30 | Disposition: A | Discharge status: Home / Self Care | Payer: Medicare HMO | Source: Ambulatory Visit | Attending: Family Medicine | Admitting: Family Medicine

## 2022-09-30 DIAGNOSIS — Z8249 Family history of ischemic heart disease and other diseases of the circulatory system: Secondary | ICD-10-CM

## 2022-09-30 DIAGNOSIS — R160 Hepatomegaly, not elsewhere classified: Secondary | ICD-10-CM | POA: Diagnosis not present

## 2022-10-02 ENCOUNTER — Other Ambulatory Visit: Payer: Self-pay | Admitting: Family Medicine

## 2022-10-02 DIAGNOSIS — R160 Hepatomegaly, not elsewhere classified: Secondary | ICD-10-CM

## 2022-10-30 ENCOUNTER — Encounter: Payer: Self-pay | Admitting: Family Medicine

## 2022-11-02 ENCOUNTER — Ambulatory Visit
Admission: RE | Admit: 2022-11-02 | Discharge: 2022-11-02 | Disposition: A | Discharge status: Home / Self Care | Payer: Medicare HMO | Source: Ambulatory Visit | Attending: Family Medicine | Admitting: Family Medicine

## 2022-11-02 DIAGNOSIS — K7689 Other specified diseases of liver: Secondary | ICD-10-CM | POA: Diagnosis not present

## 2022-11-02 DIAGNOSIS — K862 Cyst of pancreas: Secondary | ICD-10-CM | POA: Diagnosis not present

## 2022-11-02 DIAGNOSIS — R160 Hepatomegaly, not elsewhere classified: Secondary | ICD-10-CM

## 2022-11-02 MED ORDER — GADOPICLENOL 0.5 MMOL/ML IV SOLN
7.5000 mL | Freq: Once | INTRAVENOUS | Status: AC | PRN
  Administered 2022-11-02: 7.5 mL via INTRAVENOUS

## 2023-01-18 ENCOUNTER — Ambulatory Visit
Admission: RE | Admit: 2023-01-18 | Discharge: 2023-01-18 | Disposition: A | Discharge status: Home / Self Care | Payer: Medicare HMO | Source: Ambulatory Visit | Attending: Family Medicine | Admitting: Family Medicine

## 2023-01-18 DIAGNOSIS — M8580 Other specified disorders of bone density and structure, unspecified site: Secondary | ICD-10-CM

## 2023-01-18 DIAGNOSIS — E2839 Other primary ovarian failure: Secondary | ICD-10-CM | POA: Diagnosis not present

## 2023-01-18 DIAGNOSIS — Z0000 Encounter for general adult medical examination without abnormal findings: Secondary | ICD-10-CM

## 2023-01-18 DIAGNOSIS — N958 Other specified menopausal and perimenopausal disorders: Secondary | ICD-10-CM | POA: Diagnosis not present

## 2023-01-18 DIAGNOSIS — Z1231 Encounter for screening mammogram for malignant neoplasm of breast: Secondary | ICD-10-CM | POA: Diagnosis not present

## 2023-01-18 DIAGNOSIS — M8588 Other specified disorders of bone density and structure, other site: Secondary | ICD-10-CM | POA: Diagnosis not present

## 2023-07-05 DIAGNOSIS — B001 Herpesviral vesicular dermatitis: Secondary | ICD-10-CM | POA: Diagnosis not present

## 2023-07-05 DIAGNOSIS — M8588 Other specified disorders of bone density and structure, other site: Secondary | ICD-10-CM | POA: Diagnosis not present

## 2023-07-05 DIAGNOSIS — K219 Gastro-esophageal reflux disease without esophagitis: Secondary | ICD-10-CM | POA: Diagnosis not present

## 2023-07-05 DIAGNOSIS — M720 Palmar fascial fibromatosis [Dupuytren]: Secondary | ICD-10-CM | POA: Diagnosis not present

## 2023-07-05 DIAGNOSIS — Z1331 Encounter for screening for depression: Secondary | ICD-10-CM | POA: Diagnosis not present

## 2023-07-05 DIAGNOSIS — E782 Mixed hyperlipidemia: Secondary | ICD-10-CM | POA: Diagnosis not present

## 2023-07-05 DIAGNOSIS — Z0000 Encounter for general adult medical examination without abnormal findings: Secondary | ICD-10-CM | POA: Diagnosis not present

## 2023-07-05 DIAGNOSIS — H35711 Central serous chorioretinopathy, right eye: Secondary | ICD-10-CM | POA: Diagnosis not present

## 2023-08-02 DIAGNOSIS — M720 Palmar fascial fibromatosis [Dupuytren]: Secondary | ICD-10-CM | POA: Diagnosis not present

## 2023-09-30 DIAGNOSIS — Z01419 Encounter for gynecological examination (general) (routine) without abnormal findings: Secondary | ICD-10-CM | POA: Diagnosis not present

## 2023-09-30 DIAGNOSIS — N952 Postmenopausal atrophic vaginitis: Secondary | ICD-10-CM | POA: Diagnosis not present

## 2023-09-30 DIAGNOSIS — M8588 Other specified disorders of bone density and structure, other site: Secondary | ICD-10-CM | POA: Diagnosis not present

## 2023-09-30 DIAGNOSIS — Z124 Encounter for screening for malignant neoplasm of cervix: Secondary | ICD-10-CM | POA: Diagnosis not present

## 2023-09-30 DIAGNOSIS — N816 Rectocele: Secondary | ICD-10-CM | POA: Diagnosis not present

## 2023-09-30 DIAGNOSIS — Z9189 Other specified personal risk factors, not elsewhere classified: Secondary | ICD-10-CM | POA: Diagnosis not present

## 2024-02-08 ENCOUNTER — Other Ambulatory Visit: Payer: Self-pay | Admitting: Family Medicine

## 2024-02-08 ENCOUNTER — Ambulatory Visit
Admission: RE | Admit: 2024-02-08 | Discharge: 2024-02-08 | Disposition: A | Discharge status: Home / Self Care | Source: Ambulatory Visit | Attending: Family Medicine | Admitting: Family Medicine

## 2024-02-08 DIAGNOSIS — Z1231 Encounter for screening mammogram for malignant neoplasm of breast: Secondary | ICD-10-CM | POA: Diagnosis not present
